# Patient Record
Sex: Female | Born: 1955 | Race: White | Hispanic: No | Marital: Single | Smoking: Never smoker
Health system: Southern US, Community
[De-identification: ages and names within clinical notes are randomized; demographics above are authoritative.]

## PROBLEM LIST (undated history)

## (undated) DIAGNOSIS — F32A Depression, unspecified: Secondary | ICD-10-CM

## (undated) DIAGNOSIS — I10 Essential (primary) hypertension: Secondary | ICD-10-CM

## (undated) DIAGNOSIS — F329 Major depressive disorder, single episode, unspecified: Secondary | ICD-10-CM

---

## 2015-05-09 ENCOUNTER — Encounter: Payer: Self-pay | Admitting: *Deleted

## 2015-05-09 ENCOUNTER — Emergency Department
Admission: EM | Admit: 2015-05-09 | Discharge: 2015-05-11 | Disposition: A | Discharge status: Home / Self Care | Payer: Medicare Other | Attending: Emergency Medicine | Admitting: Emergency Medicine

## 2015-05-09 ENCOUNTER — Emergency Department: Payer: Medicare Other

## 2015-05-09 DIAGNOSIS — I10 Essential (primary) hypertension: Secondary | ICD-10-CM | POA: Diagnosis not present

## 2015-05-09 DIAGNOSIS — R4689 Other symptoms and signs involving appearance and behavior: Secondary | ICD-10-CM

## 2015-05-09 DIAGNOSIS — F919 Conduct disorder, unspecified: Secondary | ICD-10-CM | POA: Diagnosis not present

## 2015-05-09 DIAGNOSIS — R4182 Altered mental status, unspecified: Secondary | ICD-10-CM | POA: Diagnosis present

## 2015-05-09 DIAGNOSIS — Z79899 Other long term (current) drug therapy: Secondary | ICD-10-CM | POA: Diagnosis not present

## 2015-05-09 DIAGNOSIS — F29 Unspecified psychosis not due to a substance or known physiological condition: Secondary | ICD-10-CM

## 2015-05-09 HISTORY — DX: Major depressive disorder, single episode, unspecified: F32.9

## 2015-05-09 HISTORY — DX: Depression, unspecified: F32.A

## 2015-05-09 HISTORY — DX: Essential (primary) hypertension: I10

## 2015-05-09 LAB — COMPREHENSIVE METABOLIC PANEL
ALK PHOS: 95 U/L (ref 38–126)
ALT: 34 U/L (ref 14–54)
AST: 35 U/L (ref 15–41)
Albumin: 4.4 g/dL (ref 3.5–5.0)
Anion gap: 16 — ABNORMAL HIGH (ref 5–15)
BILIRUBIN TOTAL: 0.4 mg/dL (ref 0.3–1.2)
BUN: 36 mg/dL — ABNORMAL HIGH (ref 6–20)
CO2: 23 mmol/L (ref 22–32)
CREATININE: 1.3 mg/dL — AB (ref 0.44–1.00)
Calcium: 9.9 mg/dL (ref 8.9–10.3)
Chloride: 106 mmol/L (ref 101–111)
GFR calc Af Amer: 52 mL/min — ABNORMAL LOW (ref >60)
GFR, EST NON AFRICAN AMERICAN: 45 mL/min — AB (ref >60)
GLUCOSE: 131 mg/dL — AB (ref 65–99)
Potassium: 3.8 mmol/L (ref 3.5–5.1)
SODIUM: 145 mmol/L (ref 135–145)
Total Protein: 8.3 g/dL — ABNORMAL HIGH (ref 6.5–8.1)

## 2015-05-09 LAB — CBC WITH DIFFERENTIAL/PLATELET
Basophils Absolute: 0.1 10*3/uL (ref 0–0.1)
Basophils Relative: 1 %
Eosinophils Absolute: 0.1 10*3/uL (ref 0–0.7)
Eosinophils Relative: 1 %
HCT: 38.4 % (ref 35.0–47.0)
Hemoglobin: 12.8 g/dL (ref 12.0–16.0)
Lymphocytes Relative: 18 %
Lymphs Abs: 2.3 10*3/uL (ref 1.0–3.6)
MCH: 29.8 pg (ref 26.0–34.0)
MCHC: 33.3 g/dL (ref 32.0–36.0)
MCV: 89.6 fL (ref 80.0–100.0)
Monocytes Absolute: 1 10*3/uL — ABNORMAL HIGH (ref 0.2–0.9)
Monocytes Relative: 7 %
NEUTROS ABS: 9.7 10*3/uL — AB (ref 1.4–6.5)
Neutrophils Relative %: 73 %
Platelets: 325 10*3/uL (ref 150–440)
RBC: 4.28 MIL/uL (ref 3.80–5.20)
RDW: 14.7 % — ABNORMAL HIGH (ref 11.5–14.5)
WBC: 13.2 10*3/uL — AB (ref 3.6–11.0)

## 2015-05-09 LAB — URINE DRUG SCREEN, QUALITATIVE (ARMC ONLY)
Amphetamines, Ur Screen: POSITIVE — AB
Barbiturates, Ur Screen: NOT DETECTED
Benzodiazepine, Ur Scrn: POSITIVE — AB
CANNABINOID 50 NG, UR ~~LOC~~: NOT DETECTED
Cocaine Metabolite,Ur ~~LOC~~: NOT DETECTED
MDMA (ECSTASY) UR SCREEN: NOT DETECTED
Methadone Scn, Ur: NOT DETECTED
OPIATE, UR SCREEN: NOT DETECTED
Phencyclidine (PCP) Ur S: NOT DETECTED
Tricyclic, Ur Screen: NOT DETECTED

## 2015-05-09 LAB — URINALYSIS COMPLETE WITH MICROSCOPIC (ARMC ONLY)
Bacteria, UA: NONE SEEN
Bilirubin Urine: NEGATIVE
GLUCOSE, UA: NEGATIVE mg/dL
Hgb urine dipstick: NEGATIVE
LEUKOCYTES UA: NEGATIVE
NITRITE: NEGATIVE
PROTEIN: 30 mg/dL — AB
Specific Gravity, Urine: 1.026 (ref 1.005–1.030)
pH: 5 (ref 5.0–8.0)

## 2015-05-09 LAB — ETHANOL: Alcohol, Ethyl (B): <5 mg/dL (ref <5)

## 2015-05-09 MED ORDER — LORAZEPAM 2 MG/ML IJ SOLN
INTRAMUSCULAR | Status: AC
  Filled 2015-05-09: qty 1

## 2015-05-09 MED ORDER — LORAZEPAM 2 MG/ML IJ SOLN
2.0000 mg | Freq: Once | INTRAMUSCULAR | Status: AC
  Administered 2015-05-09: 2 mg via INTRAVENOUS

## 2015-05-09 MED ORDER — DIPHENHYDRAMINE HCL 50 MG/ML IJ SOLN
INTRAMUSCULAR | Status: AC
  Filled 2015-05-09: qty 1

## 2015-05-09 MED ORDER — DIPHENHYDRAMINE HCL 50 MG/ML IJ SOLN
25.0000 mg | Freq: Once | INTRAMUSCULAR | Status: AC
  Administered 2015-05-09: 25 mg via INTRAVENOUS

## 2015-05-09 MED ORDER — HALOPERIDOL LACTATE 5 MG/ML IJ SOLN
5.0000 mg | Freq: Once | INTRAMUSCULAR | Status: AC
  Administered 2015-05-09 – 2015-05-10 (×2): 5 mg via INTRAVENOUS

## 2015-05-09 MED ORDER — HALOPERIDOL LACTATE 5 MG/ML IJ SOLN
INTRAMUSCULAR | Status: AC
  Filled 2015-05-09: qty 1

## 2015-05-09 MED ORDER — SODIUM CHLORIDE 0.9 % IV BOLUS (SEPSIS)
1000.0000 mL | Freq: Once | INTRAVENOUS | Status: AC
  Administered 2015-05-09: 1000 mL via INTRAVENOUS

## 2015-05-09 NOTE — ED Notes (Signed)
Patient remains hyperactive, rolling back and forward, humming and stating "ow", but denies pain. Patient remains cooperative with requests, but is slow to respond. Patient was able to drink water from a cup, but had repeated reminders to control actions to avoid spilling water.

## 2015-05-09 NOTE — ED Notes (Signed)
Patient remains sleeping. VSS. Patient remains on monitor. NAD.

## 2015-05-09 NOTE — ED Notes (Signed)
Patient became more agitated after being awakened by MD.
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
## 2015-05-09 NOTE — ED Notes (Signed)
PT IS RESTING COMFORTABLY. PT HAS FINALLY CALMED DOWN. DR. Derrill KayGOODMAN CAME BY BEDSIDE TO CHECK ON PT.

## 2015-05-09 NOTE — ED Notes (Signed)
Patient is calm now. Sitter remains at bedside.

## 2015-05-09 NOTE — ED Notes (Signed)
Patient is sleeping. Patient is on the monitor. No sitter needed at this time.

## 2015-05-09 NOTE — ED Notes (Signed)
Sitter at bedside.

## 2015-05-09 NOTE — ED Notes (Signed)
Patient remains hyperactive, appears flushed. Sitter at bedside.

## 2015-05-09 NOTE — ED Notes (Signed)
Patient placed on 2L O2 via [place] for sats at 90%.

## 2015-05-09 NOTE — ED Notes (Signed)
Per Patient's sister, Vanessa Moody, HCPOA, the patient is being seen by Dr[facility] Jeannine [facility]Farah at [facility] at [phone].
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
## 2015-05-10 DIAGNOSIS — F29 Unspecified psychosis not due to a substance or known physiological condition: Secondary | ICD-10-CM

## 2015-05-10 LAB — GLUCOSE, CAPILLARY: GLUCOSE-CAPILLARY: 124 mg/dL — AB (ref 65–99)

## 2015-05-10 MED ORDER — PINDOLOL 5 MG PO TABS
10.0000 mg | ORAL_TABLET | Freq: Every day | ORAL | Status: DC
  Administered 2015-05-11: 10 mg via ORAL
  Filled 2015-05-10 (×3): qty 1

## 2015-05-10 MED ORDER — DULOXETINE HCL 60 MG PO CPEP
ORAL_CAPSULE | ORAL | Status: AC
  Administered 2015-05-10: 60 mg via ORAL
  Filled 2015-05-10: qty 1

## 2015-05-10 MED ORDER — LORAZEPAM 2 MG/ML IJ SOLN
1.0000 mg | Freq: Once | INTRAMUSCULAR | Status: AC
  Administered 2015-05-10: 1 mg via INTRAVENOUS

## 2015-05-10 MED ORDER — HALOPERIDOL LACTATE 5 MG/ML IJ SOLN
INTRAMUSCULAR | Status: AC
  Administered 2015-05-10: 5 mg via INTRAVENOUS
  Filled 2015-05-10: qty 1

## 2015-05-10 MED ORDER — HALOPERIDOL 0.5 MG PO TABS
1.0000 mg | ORAL_TABLET | Freq: Two times a day (BID) | ORAL | Status: DC
  Administered 2015-05-10: 1 mg via ORAL

## 2015-05-10 MED ORDER — HALOPERIDOL 0.5 MG PO TABS
ORAL_TABLET | ORAL | Status: AC
  Administered 2015-05-10: 1 mg via ORAL
  Filled 2015-05-10: qty 2

## 2015-05-10 MED ORDER — LORAZEPAM 2 MG/ML IJ SOLN
INTRAMUSCULAR | Status: AC
  Administered 2015-05-10: 1 mg via INTRAVENOUS
  Filled 2015-05-10: qty 1

## 2015-05-10 MED ORDER — DULOXETINE HCL 60 MG PO CPEP
60.0000 mg | ORAL_CAPSULE | Freq: Every day | ORAL | Status: DC
  Administered 2015-05-10 – 2015-05-11 (×2): 60 mg via ORAL

## 2015-05-10 NOTE — ED Notes (Signed)
BEHAVIORAL HEALTH ROUNDING Patient sleeping: Yes.   Patient alert and oriented: not applicable Behavior appropriate: Yes.  ; If no, describe:  Nutrition and fluids offered: No Toileting and hygiene offered: Yes  and No Sitter present: yes Law enforcement present: Yes   

## 2015-05-10 NOTE — ED Notes (Signed)
Patient now asleep. Will continue to monitor.

## 2015-05-10 NOTE — ED Notes (Signed)
Report given by Clint LippsLea, RN, received by this nurse

## 2015-05-10 NOTE — ED Notes (Signed)
Called pharmacy about beta-blocker (visken)

## 2015-05-10 NOTE — ED Notes (Signed)
BEHAVIORAL HEALTH ROUNDING Patient sleeping: Yes.   Patient alert and oriented: not applicable Behavior appropriate: Yes.  ; If no, describe:  Nutrition and fluids offered: No Toileting and hygiene offered: No Sitter present: yes Law enforcement present: Yes   

## 2015-05-10 NOTE — ED Notes (Signed)
Pt alert and grunting.  Sitter reports pt has been sitting up but has not pulled at oxygen or IV.  Sitter instructed to call this nurse right away if pt's restlessness progresses.

## 2015-05-10 NOTE — ED Notes (Signed)
BEHAVIORAL HEALTH ROUNDING Patient sleeping: Yes.   Patient alert and oriented: yes Behavior appropriate: Yes.  ;  Nutrition and fluids offered: Yes  Toileting and hygiene offered: Yes  Sitter present: yes Law enforcement present: Yes  

## 2015-05-10 NOTE — ED Notes (Signed)
This RN spoke to pt's sister/POA, pt verbalized okay to give sister information over the phone. Sister would like to be contacted regarding pt's condition as things progress. Sister's primary contact info: Verlon AuCheryl Smith [phone]. Sister requested that message be left if she does not answer.

## 2015-05-10 NOTE — ED Notes (Signed)
Pt asleep at this time, sitter present at bedside.

## 2015-05-10 NOTE — ED Notes (Signed)
While RN was in room speaking to pt, RN noted pt is confused, has disoriented thinking, struggling to have fluid thought process while attempting to contact sister. RN made CSW Stanton KidneyDebra made aware, Claypacks made aware. Per MD Claypacks, pt will stay the night and be re-evaluated in the am. Pt's sister Cherly made aware by this RN via telephone, sister states will be here tomorrow to pick pt up for discharge.
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
## 2015-05-10 NOTE — ED Notes (Addendum)
CBG 124. Pt denies pain. Alert and oriented to place, situation. Unable to state date. Pt given water. Denies being hungry. Sitter at bedside

## 2015-05-10 NOTE — ED Notes (Signed)
Pt resting in bed, appears to be sleeping, color WNL. Sitter at bedside.

## 2015-05-10 NOTE — ED Notes (Signed)
BEHAVIORAL HEALTH ROUNDING Patient sleeping: No. Patient alert and oriented: yes Behavior appropriate: Yes.  ;  Nutrition and fluids offered: Yes  Toileting and hygiene offered: Yes  Sitter present: yes Law enforcement present: Yes  

## 2015-05-10 NOTE — ED Notes (Signed)
BEHAVIORAL HEALTH ROUNDING Patient sleeping: No. Patient alert and oriented: yes Behavior appropriate: Yes.  ; If no, describe:  Nutrition and fluids offered: Yes  Toileting and hygiene offered: Yes  Sitter present: no Law enforcement present: Yes  

## 2015-05-10 NOTE — Discharge Instructions (Signed)
Please follow-up with your regular psychiatrist. These returned to the hospital should you feel the symptoms of anger depression or have any other problems

## 2015-05-10 NOTE — ED Notes (Signed)

## 2015-05-10 NOTE — ED Notes (Signed)
ENVIRONMENTAL ASSESSMENT Potentially harmful objects out of patient reach: No. Personal belongings secured: Yes.   Patient dressed in hospital provided attire only: No. Plastic bags out of patient reach: Yes.   Patient care equipment (cords, cables, call bells, lines, and drains) shortened, removed, or accounted for: No. Equipment and supplies removed from bottom of stretcher: Yes.   Potentially toxic materials out of patient reach: Yes.   Sharps container removed or out of patient reach: No.

## 2015-05-10 NOTE — ED Provider Notes (Signed)
0105 Assumed care of patient. Appears restless in bed, moving all limbs erratically. Oriented to self and answers questions appropriately. Of note, erratic movement of limbs stops when patient is answering questions. Specifically questioned patient on EtOH use. Patient denies drinking alcohol. ? alcohol withdrawal symptoms ? psychiatric component to erratic behavior. Will continue to monitor and await psychiatry consult in the morning.  ----------------------------------------- 5:50 AM on 05/10/2015 -----------------------------------------  Ativan given for restlessness and tachycardia. Now patient tells nurse she drank alcohol but unable to quantify. Will place patient on CIWA protocol for probable alcohol withdrawal.  ----------------------------------------- 7:23 AM on 05/10/2015 -----------------------------------------  Patient resting in no acute distress. Awaiting psychiatry consult this morning. Will continue to monitor. Care transferred to Dr. Inocencio HomesGayle.  Irean HongJade J Sung, MD 05/10/15 [phone]

## 2015-05-10 NOTE — ED Notes (Signed)
Patient thrashing about in bed. Awakens to touch. [place] [address]. Reoriented to place and time. Medication given. WIll continue to monitor.

## 2015-05-10 NOTE — ED Notes (Signed)
Pt assisted to toilet with 2-person assist and stand-pivot.  Pt unsteady but able to bear weight.  Pt has some difficulty following directions.  Pt oriented to person and time, but unsure about place.  Pt reports drinking alcohol but unable to say what kind or how much.  Pt reports nausea when standing, but denies nausea once back in bed.  Pt NAD at this time.

## 2015-05-10 NOTE — ED Notes (Signed)
Pt responds to voice, oriented to self only.  Pt restless in bed but not trying to get up or pull at lines.  Pt on 2L o2 via [place].  Pt NAD at this time.  Sitter present in room.

## 2015-05-10 NOTE — ED Notes (Signed)
Pt given meal tray at this time, pt sitting up in bed made aware of food but pt is not eating. Pt sleeping and has disorganized thinking and thought process at this time. CSW made aware, CSW at bedside at this time.

## 2015-05-10 NOTE — ED Provider Notes (Signed)
Patient seen and evaluated by Dr. Toni Amendlapacs,  patient is not suicidal or homicidal. Patient has her own private psychiatrist. Dr. Toni Amendlapacs feels that patient is safe to go back home and follow-up with him as has been planned  [name], MD 05/10/15 [phone]

## 2015-05-10 NOTE — ED Notes (Signed)
Pt unaware where her car is located. This RN contacted [place] [employer] who [place] car. [place], [address] Pt made aware and verbalized understanding at this time.

## 2015-05-10 NOTE — ED Notes (Signed)
Dr.Clapacs at bedside  
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
## 2015-05-11 MED ORDER — HALOPERIDOL 0.5 MG PO TABS
ORAL_TABLET | ORAL | Status: AC
  Filled 2015-05-11: qty 2

## 2015-05-11 MED ORDER — DULOXETINE HCL 60 MG PO CPEP
ORAL_CAPSULE | ORAL | Status: AC
  Administered 2015-05-11: 60 mg via ORAL
  Filled 2015-05-11: qty 1

## 2015-05-11 MED ORDER — PANTOPRAZOLE SODIUM 40 MG PO TBEC
40.0000 mg | DELAYED_RELEASE_TABLET | Freq: Every day | ORAL | Status: DC
  Administered 2015-05-11: 40 mg via ORAL

## 2015-05-11 MED ORDER — PANTOPRAZOLE SODIUM 40 MG PO TBEC
DELAYED_RELEASE_TABLET | ORAL | Status: AC
  Administered 2015-05-11: 40 mg via ORAL
  Filled 2015-05-11: qty 1

## 2015-05-11 NOTE — Progress Notes (Signed)
Patient evaluated by psych MD with disposition to Discontinue IVC. Initiate treatment in the emergency room and Reevaluate prior to discharge if needed. CSW in to meet with patient for continued assessment. Patient is alert and oriented with minimal confusion.  She is engaged in conversation with CSW though she does not remember much about yesterday.  Patient is eager to discharge home.  She has contacted a friend that will pick her up.    Per Psch MD patient will continue on outpatient medication and follow up with her doctor.  CSW discussed updates on patient with ED-MD.

## 2015-05-11 NOTE — ED Notes (Signed)

## 2015-05-11 NOTE — ED Provider Notes (Signed)
-----------------------------------------   10:06 AM on 05/11/2015 -----------------------------------------  Patient is awake, alert, tachycardia resolved, she is to be mentating appropriately and is appropriate for discharge. Her sister is coming to pick her up. DC with outpatient follow-up.  Gayla DossEryka A Elysa Womac, MD 05/11/15 [id number]

## 2015-05-11 NOTE — ED Notes (Signed)
BEHAVIORAL HEALTH ROUNDING  Patient sleeping: Yes.  Patient alert and oriented: no  Behavior appropriate: Yes. ; If no, describe:  Nutrition and fluids offered: No  Toileting and hygiene offered: No  Sitter present: no  Law enforcement present: Yes   

## 2015-05-11 NOTE — ED Provider Notes (Signed)
-----------------------------------------   12:57 AM on 05/11/2015 -----------------------------------------   BP 150/73 mmHg  Pulse 95  Temp(Src) 98.5 F (36.9 C) (Oral)  Resp 18  Ht 5\' 4"  (1.626 m)  Wt 222 lb (100.699 kg)  BMI 38.09 kg/m2  SpO2 97%  The patient had no acute events since last update.  Calm and cooperative at this time.  Disposition is pending per Psychiatry/Behavioral Medicine team recommendations.     Myrna Blazeravid Matthew Kelin Nixon, MD 05/11/15 [phone]

## 2015-05-11 NOTE — ED Notes (Signed)
BEHAVIORAL HEALTH ROUNDING Patient sleeping: Yes.   Patient alert and oriented: not applicable Behavior appropriate: Yes.  ; If no, describe:  Nutrition and fluids offered: No Toileting and hygiene offered: No Sitter present: no Law enforcement present: Yes  

## 2015-05-11 NOTE — ED Provider Notes (Signed)
-----------------------------------------   8:10 AM on 05/11/2015 -----------------------------------------   BP 158/92 mmHg  Pulse 103  Temp(Src) 98.5 F (36.9 C) (Oral)  Resp 16  Ht 5\' 4"  (1.626 m)  Wt 222 lb (100.699 kg)  BMI 38.09 kg/m2  SpO2 94%  The patient had no acute events since last update.  Resting comfortably. One isolated mildly tachycardic heart rate measurement - will continue to monitor. Disposition is pending per Psychiatry/Behavioral Medicine team recommendations.     Gayla DossEryka A Whitney Hillegass, MD 05/11/15 [phone]

## 2015-05-11 NOTE — ED Notes (Signed)

## 2015-05-11 NOTE — ED Notes (Signed)
D/c instructions reviewed w/ pt - pt denies any further questions or concerns at present.   

## 2015-05-11 NOTE — ED Notes (Signed)
Report received from previous nurse

## 2016-07-10 IMAGING — CT CT HEAD W/O CM
2 series · 14 of 30 positions shown, 16 images · non-contrast
Comparison: 12/16/2014

CLINICAL DATA: Altered mental status

EXAM:
CT HEAD WITHOUT CONTRAST
TECHNIQUE: Contiguous axial images were obtained from the base of the skull
through the vertex without intravenous contrast.

[Series 2: head bone · axial · 0.40mm/px · z∈[-146,-6]mm · 8 of 88 slices shown]
[im 9/88  bone]
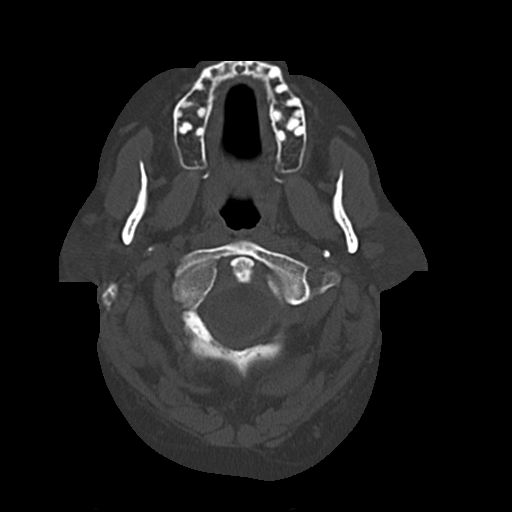
[im 17/88  bone]
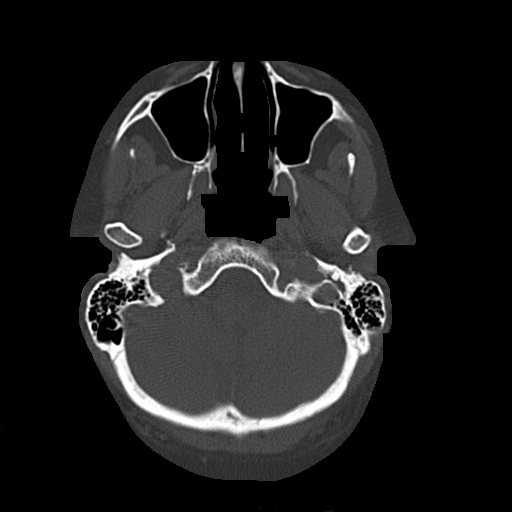
[im 30/88  bone]
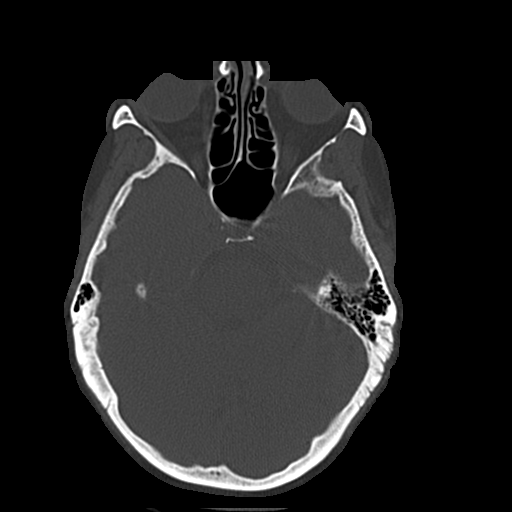
[im 38/88  bone]
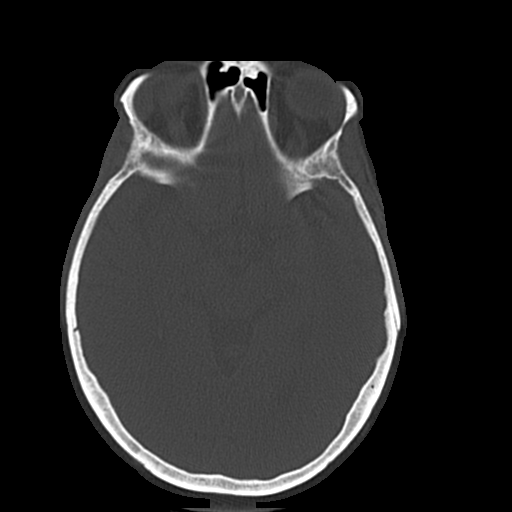
[im 50/88  bone]
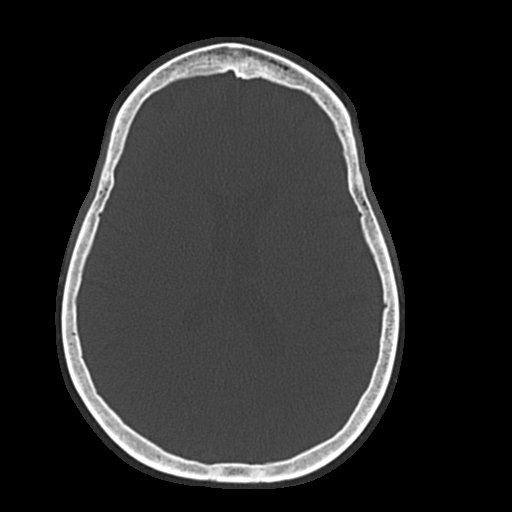
[im 59/88  bone]
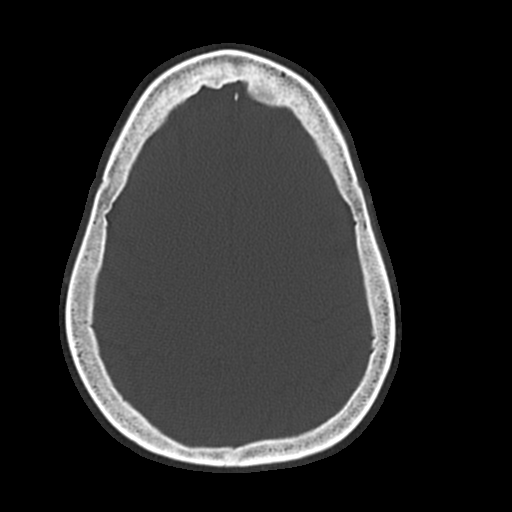
[im 71/88  bone]
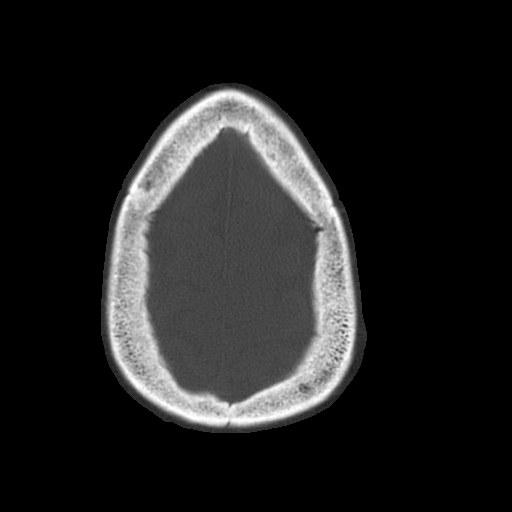
[im 79/88  bone]
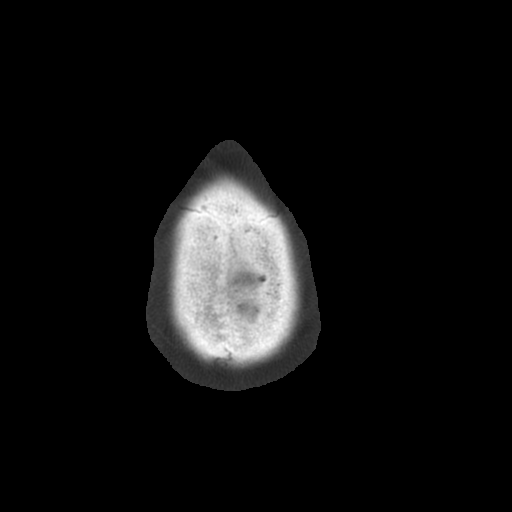

[Series 3: head wo · axial · 0.40mm/px · z∈[-132,-22]mm · 6 of 32 slices shown, 8 images]
[im 5/32  brain]
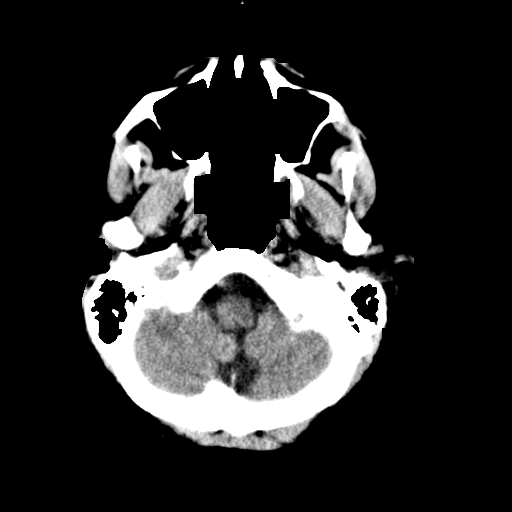
[im 5/32  bone]
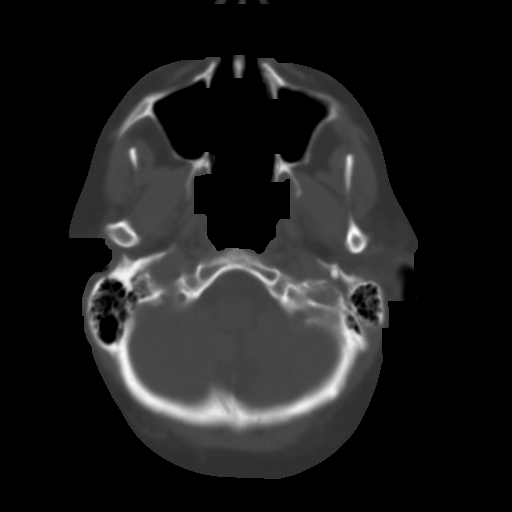
[im 9/32  brain]
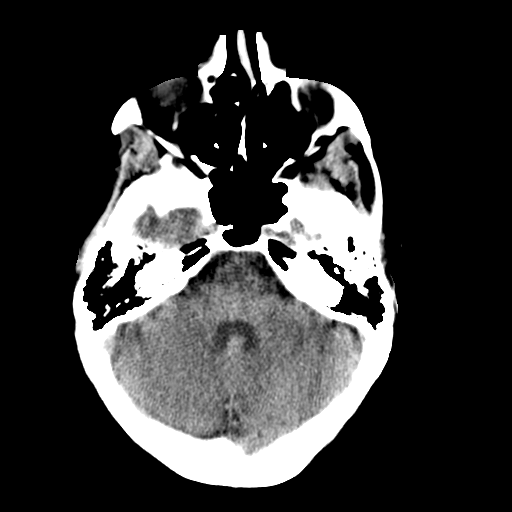
[im 14/32  brain]
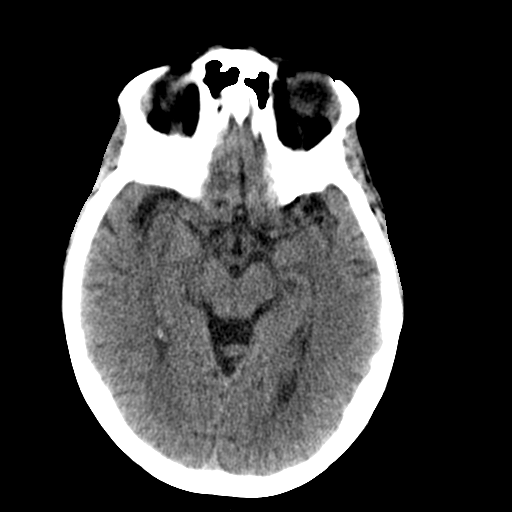
[im 18/32  brain]
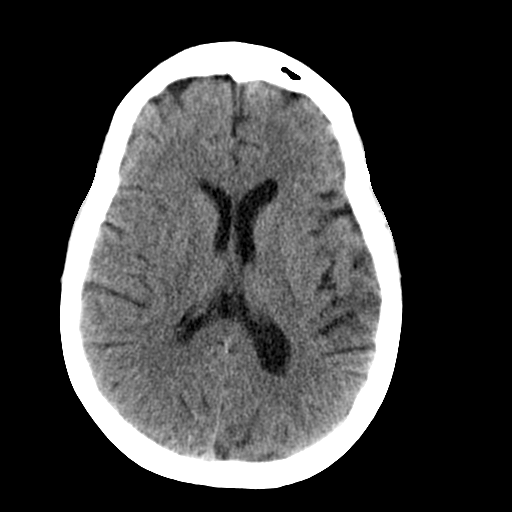
[im 23/32  brain]
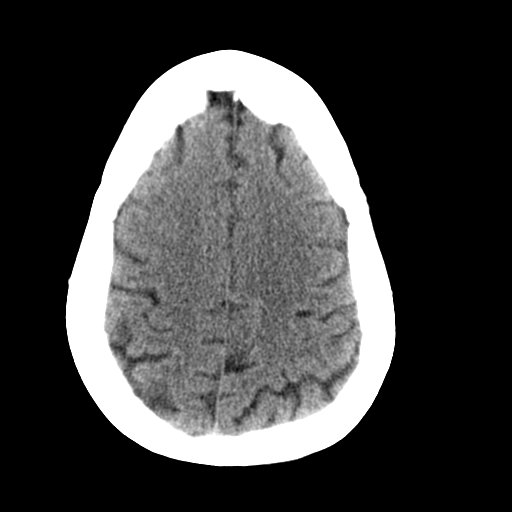
[im 23/32  bone]
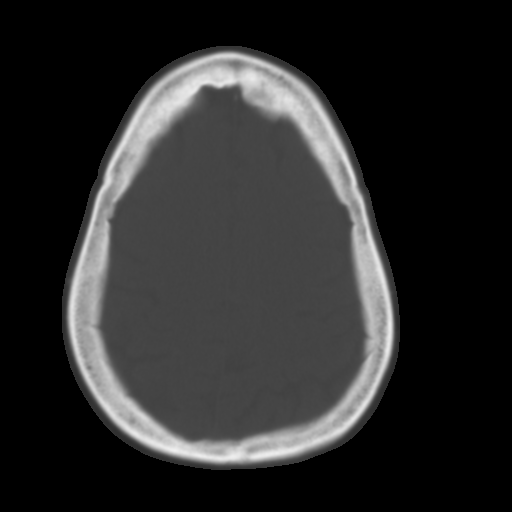
[im 27/32  brain]
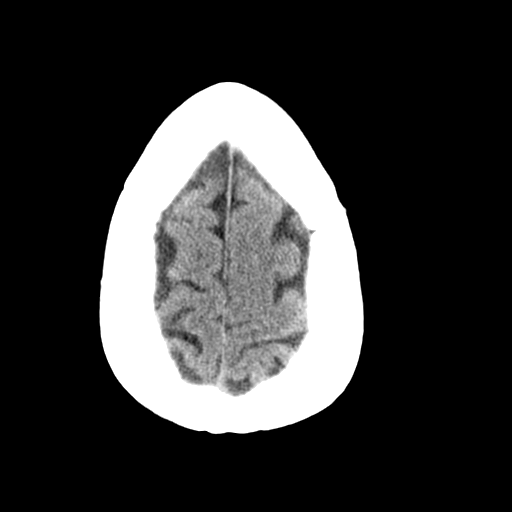

[14 of 30 positions shown; findings below may reference images not displayed]

FINDINGS: Mild age advanced atrophy with sulcal prominence, most conspicuous
about the left temporal lobe, unchanged. Gray-white differentiation
is maintained. No CT evidence of acute large territory infarct. No
intraparenchymal or extra-axial mass or hemorrhage. Normal size a
configuration of the ventricles and basilar cisterns. No midline
shift. Intracranial atherosclerosis. Limited visualization the
paranasal sinuses and mastoid air cells is normal. No air-fluid
levels. Regional soft tissues appear normal. No displaced calvarial
fracture.
IMPRESSION: Unchanged age advanced atrophy, asymmetrically involving the left
temporal lobe, without acute intracranial process.
